# Patient Record
Sex: Female | Born: 1995 | Race: White | Hispanic: No | Marital: Single | Smoking: Never smoker
Health system: Southern US, Community
[De-identification: ages and names within clinical notes are randomized; demographics above are authoritative.]

## PROBLEM LIST (undated history)

## (undated) HISTORY — PX: APPENDECTOMY: SHX54

---
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
## 2015-02-16 DIAGNOSIS — I456 Pre-excitation syndrome: Secondary | ICD-10-CM | POA: Diagnosis not present

## 2015-02-16 DIAGNOSIS — J029 Acute pharyngitis, unspecified: Secondary | ICD-10-CM | POA: Diagnosis not present

## 2015-02-16 DIAGNOSIS — J069 Acute upper respiratory infection, unspecified: Secondary | ICD-10-CM | POA: Diagnosis not present

## 2015-04-16 ENCOUNTER — Ambulatory Visit
Admission: RE | Admit: 2015-04-16 | Discharge: 2015-04-16 | Disposition: A | Discharge status: Home / Self Care | Payer: Managed Care, Other (non HMO) | Source: Ambulatory Visit | Attending: Family Medicine | Admitting: Family Medicine

## 2015-04-16 ENCOUNTER — Other Ambulatory Visit: Payer: Self-pay | Admitting: Family Medicine

## 2015-04-16 DIAGNOSIS — R1031 Right lower quadrant pain: Secondary | ICD-10-CM

## 2015-04-17 ENCOUNTER — Other Ambulatory Visit: Payer: Self-pay | Admitting: Family Medicine

## 2015-04-17 ENCOUNTER — Ambulatory Visit: Payer: BLUE CROSS/BLUE SHIELD

## 2015-04-17 ENCOUNTER — Ambulatory Visit
Admission: RE | Admit: 2015-04-17 | Discharge: 2015-04-17 | Disposition: A | Discharge status: Home / Self Care | Payer: Managed Care, Other (non HMO) | Source: Ambulatory Visit | Attending: Family Medicine | Admitting: Family Medicine

## 2015-04-17 DIAGNOSIS — R102 Pelvic and perineal pain: Secondary | ICD-10-CM | POA: Insufficient documentation

## 2015-04-17 DIAGNOSIS — N83202 Unspecified ovarian cyst, left side: Secondary | ICD-10-CM | POA: Insufficient documentation

## 2015-04-17 MED ORDER — IOPAMIDOL (ISOVUE-300) INJECTION 61%
100.0000 mL | Freq: Once | INTRAVENOUS | Status: AC | PRN
  Administered 2015-04-17: 100 mL via INTRAVENOUS

## 2015-05-14 ENCOUNTER — Ambulatory Visit: Payer: BLUE CROSS/BLUE SHIELD | Admitting: Family Medicine

## 2015-05-15 ENCOUNTER — Encounter: Payer: Self-pay | Admitting: Family Medicine

## 2015-05-15 ENCOUNTER — Ambulatory Visit (INDEPENDENT_AMBULATORY_CARE_PROVIDER_SITE_OTHER): Payer: Managed Care, Other (non HMO) | Admitting: Family Medicine

## 2015-05-15 VITALS — BP 108/63 | HR 57 | Temp 98.8°F | Resp 16

## 2015-05-15 DIAGNOSIS — I456 Pre-excitation syndrome: Secondary | ICD-10-CM

## 2015-05-15 NOTE — Progress Notes (Signed)
Patient ID: Cheryl Shelton, female   DOB: 26-Feb-1995, 20 y.o.   MRN: [id number]  Patient presents today for EKG. Patient patient had her ablation on 04/07/2015. An EKG was requested by the cardiologist a month later. Patient denies having any cardiac problems since ablation. She has been doing physical activity without having any chest pain, palpitations, shortness of breath, syncope, presyncope, headache. 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 At shows sinus bradycardia which is normal for this athlete. We will send a copy of the EKG to her EP physician, Dr. Delight Ovensallan. She has been released for full athletic activity from the EP physician.

## 2015-05-18 ENCOUNTER — Ambulatory Visit (INDEPENDENT_AMBULATORY_CARE_PROVIDER_SITE_OTHER): Payer: Managed Care, Other (non HMO) | Admitting: Family Medicine

## 2015-05-18 ENCOUNTER — Encounter: Payer: Self-pay | Admitting: Family Medicine

## 2015-05-18 VITALS — BP 114/53 | HR 73 | Temp 98.3°F | Resp 16

## 2015-05-18 DIAGNOSIS — J039 Acute tonsillitis, unspecified: Secondary | ICD-10-CM | POA: Diagnosis not present

## 2015-05-18 MED ORDER — AMOXICILLIN 500 MG PO CAPS: 500.0000 mg | ORAL_CAPSULE | Freq: Two times a day (BID) | ORAL | Status: AC

## 2015-05-18 NOTE — Progress Notes (Addendum)
Patient ID: Cheryl Shelton, female   DOB: 11/26/1995, 20 y.o.   MRN: [id number]  Patient presents today with symptoms of sore throat and fever. Patient states that she's had the symptoms for the last 2 days. She denies any cough, abdominal pain. She has some fatigue. She does have a neighbor who has had strep pharyngitis recently. She denies any other symptoms. She did take Tylenol earlier this morning.  ROS: Negative except mentioned above.  Vitals as per Epic. GENERAL: NAD HEENT: moderate pharyngeal erythema, mild tonsillar enlargement bilaterally, no exudate, no erythema of TMs, mild  cervical LAD RESP: CTA B CARD: RRR ABD: +BS, NT/ND, no organomegly appreciated NEURO: CN II-XII grossly intact   A/P: Pharngitis/Tonsillitis- rapid strep test was positive after patient left, flu screen was negative, will treat with Amoxicillin. Rest, hydration, Tylenol/Motrin when necessary, seek medical attention if symptoms persist or worsen. She is not to do any physical activity until her fever has resolved.

## 2016-02-29 ENCOUNTER — Ambulatory Visit (INDEPENDENT_AMBULATORY_CARE_PROVIDER_SITE_OTHER): Payer: BLUE CROSS/BLUE SHIELD | Admitting: Family Medicine

## 2016-02-29 ENCOUNTER — Encounter: Payer: Self-pay | Admitting: Family Medicine

## 2016-02-29 VITALS — BP 120/62 | HR 55 | Temp 98.5°F | Resp 14

## 2016-02-29 DIAGNOSIS — J029 Acute pharyngitis, unspecified: Secondary | ICD-10-CM

## 2016-02-29 DIAGNOSIS — H578 Other specified disorders of eye and adnexa: Secondary | ICD-10-CM

## 2016-02-29 DIAGNOSIS — H5789 Other specified disorders of eye and adnexa: Secondary | ICD-10-CM

## 2016-02-29 LAB — POCT RAPID STREP A (OFFICE): RAPID STREP A SCREEN: NEGATIVE

## 2016-02-29 LAB — POCT INFLUENZA A/B
INFLUENZA A, POC: NEGATIVE
INFLUENZA B, POC: NEGATIVE

## 2016-02-29 MED ORDER — POLYMYXIN B-TRIMETHOPRIM 10000-0.1 UNIT/ML-% OP SOLN: 1.0000 [drp] | Freq: Four times a day (QID) | OPHTHALMIC | 0 refills | Status: AC

## 2016-02-29 NOTE — Progress Notes (Signed)
Patient presents with symptoms of left eye redness, sore throat and postnasal drip. Patient's symptoms have been going on for about 2 days. Patient denies any fever, chills, productive cough. She does have teammates that have the flu. She denies any significant drainage from the eyes. Patient denies wearing contact lenses or glasses.  ROS: Negative except mentioned above. Vitals as per Epic. GENERAL: NAD HEENT: mild pharyngeal erythema, no exudate, no erythema of TMs, no cervical LAD, mild erythema of lateral corner of left eye, no discharge appreciated, PERRL, EOMI RESP: CTA B CARD: RRR NEURO: CN II-XII grossly intact   A/P: Left eye redness, pharyngitis -likely viral, flu screen and rapid strep test were negative in the office, Ibuprofen when necessary, Claritin when necessary, will prescribe Polytrim ophthalmic in case bacterial, wash hands frequently, seek medical attention if symptoms persist or worsen as discussed.

## 2017-04-30 IMAGING — CT CT ABD-PELV W/ CM
2 of 5 series · 15 of 46 positions shown, 17 images · IV contrast (iopamidol)
Comparison: None.

CLINICAL DATA: 19-year-old female with right pelvic pain for 10
days. History of catheter ablation for WPW performed 10 days ago.

EXAM:
CT ABDOMEN AND PELVIS WITH CONTRAST
TECHNIQUE: Multidetector CT imaging of the abdomen and pelvis was performed
using the standard protocol following bolus administration of
intravenous contrast.
CONTRAST:  100mL [id number]-ROO IOPAMIDOL (6OXP1S-ROO) INJECTION 61%

[Series 2: routine abd pel with · axial · 0.63mm/px · z∈[-1490,-1076]mm · 12 of 97 slices shown, 14 images]
[im 7/97  soft-tissue]
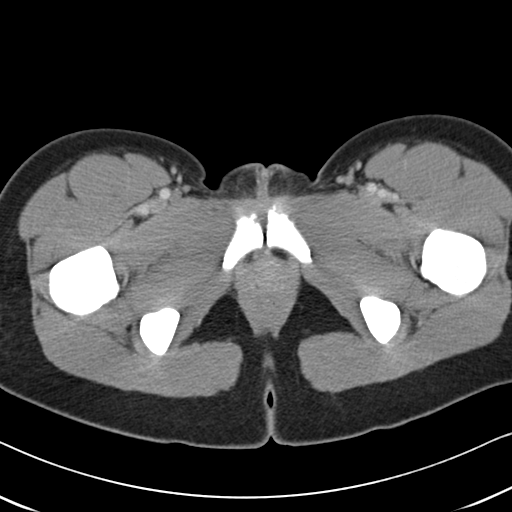
[im 7/97  bone]
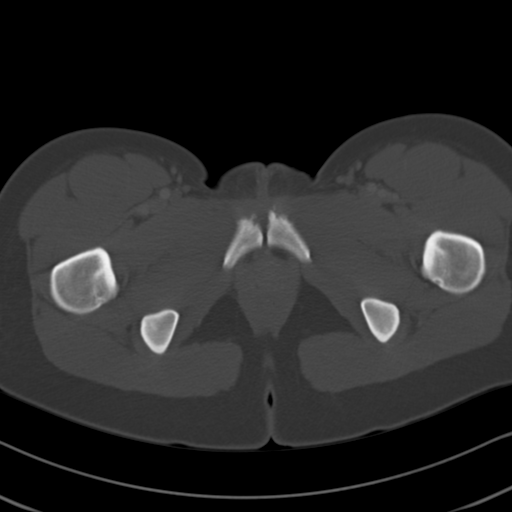
[im 14/97  soft-tissue]
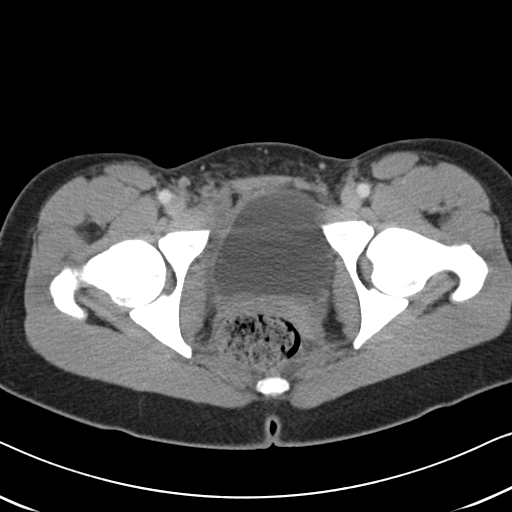
[im 21/97  soft-tissue]
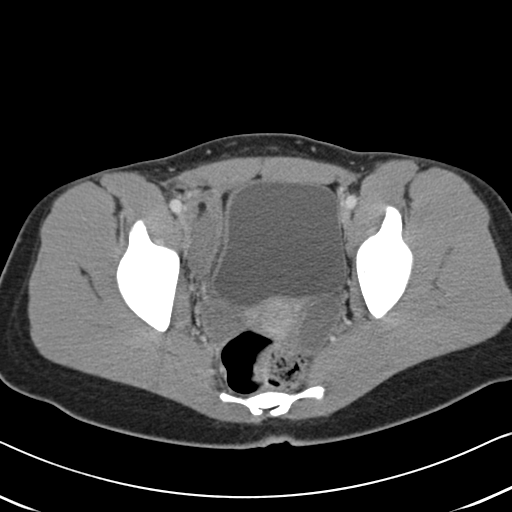
[im 28/97  soft-tissue]
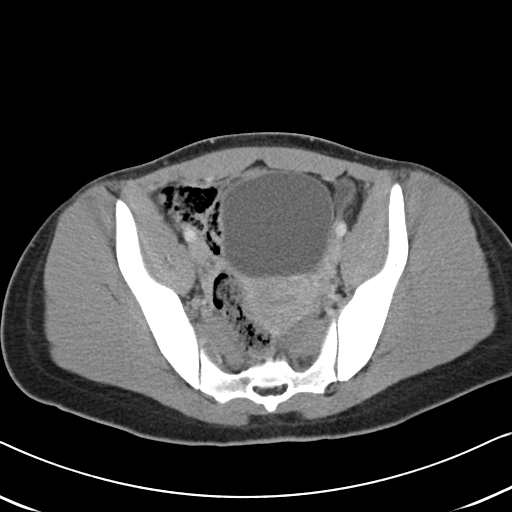
[im 35/97  soft-tissue]
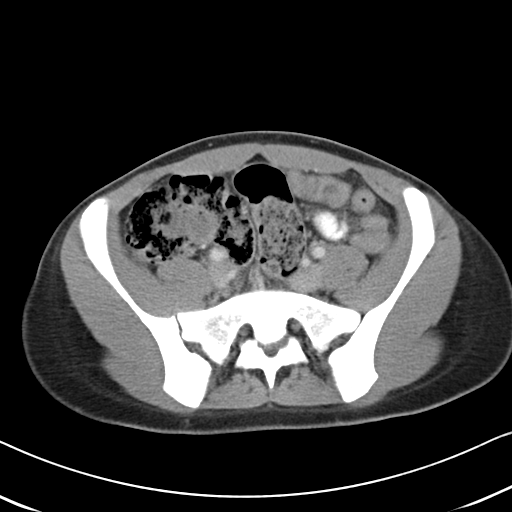
[im 42/97  soft-tissue]
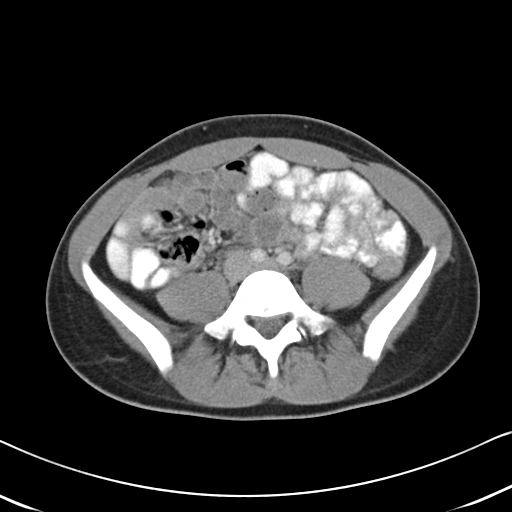
[im 55/97  soft-tissue]
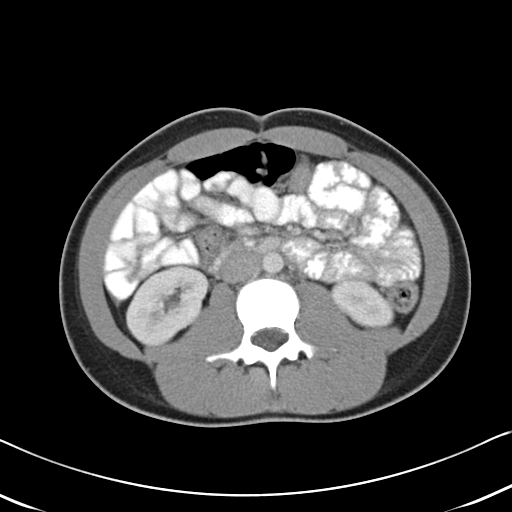
[im 62/97  soft-tissue]
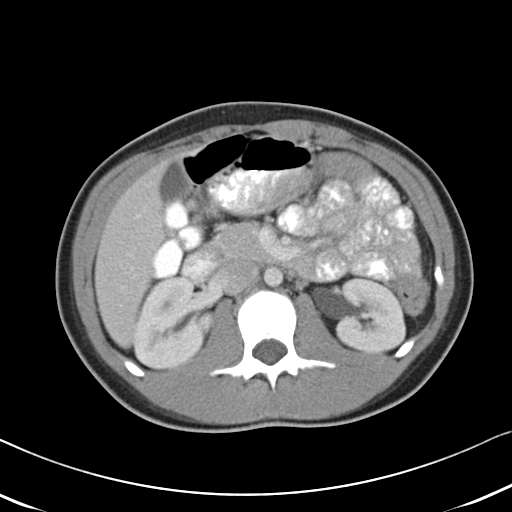
[im 69/97  soft-tissue]
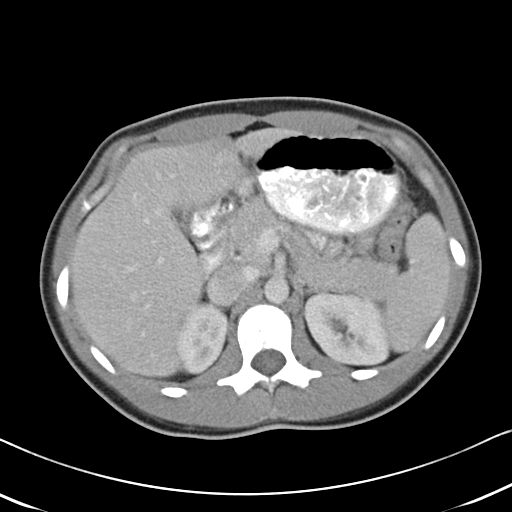
[im 69/97  bone]
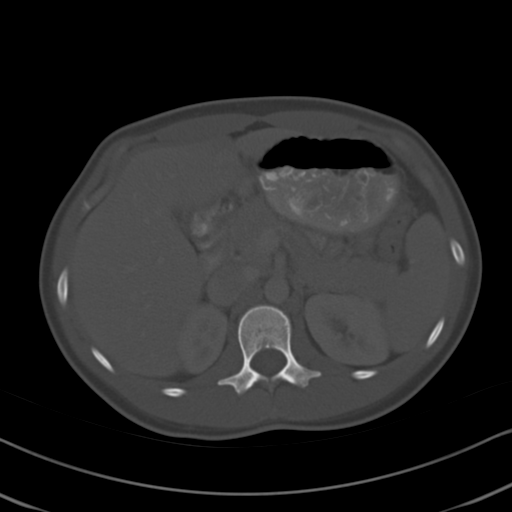
[im 76/97  soft-tissue]
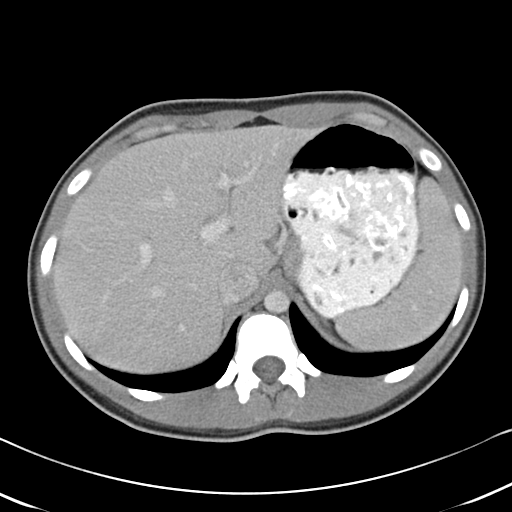
[im 83/97  soft-tissue]
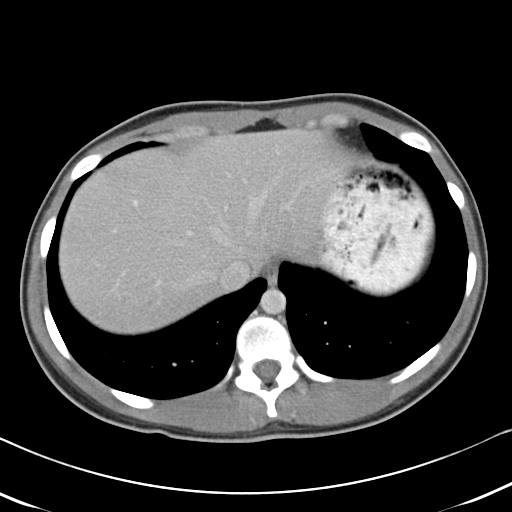
[im 90/97  soft-tissue]
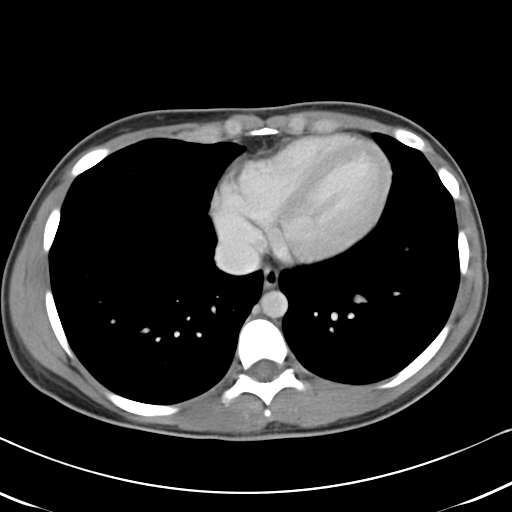

[Series 5: cor routine abd pel with · coronal · 0.54mm/px · 3 of 106 slices shown]
[im 36/106  soft-tissue]
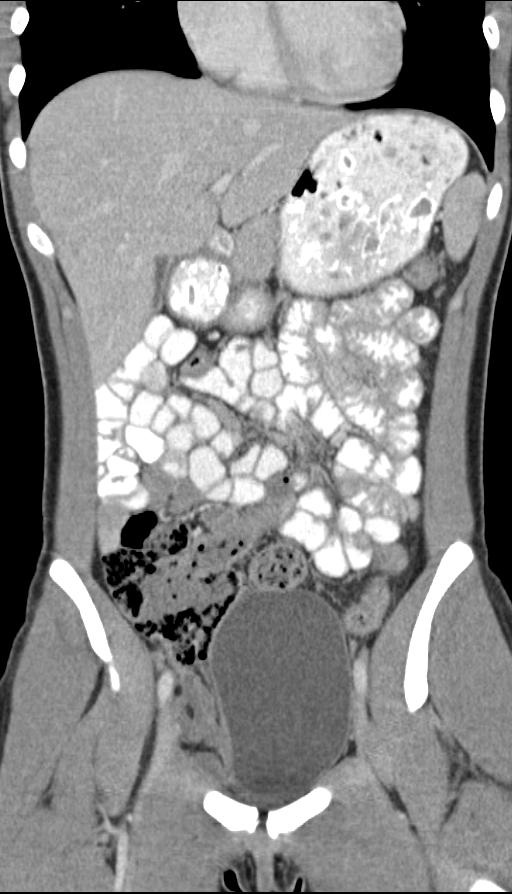
[im 47/106  soft-tissue]
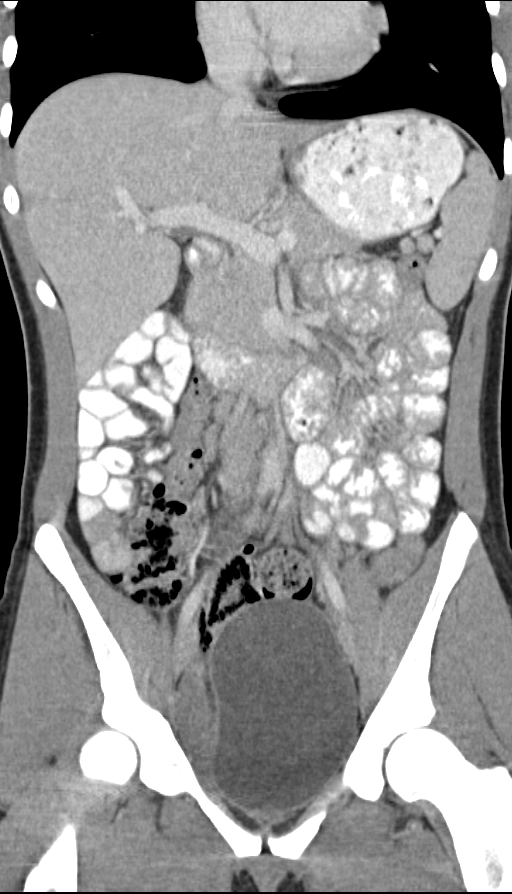
[im 59/106  soft-tissue]
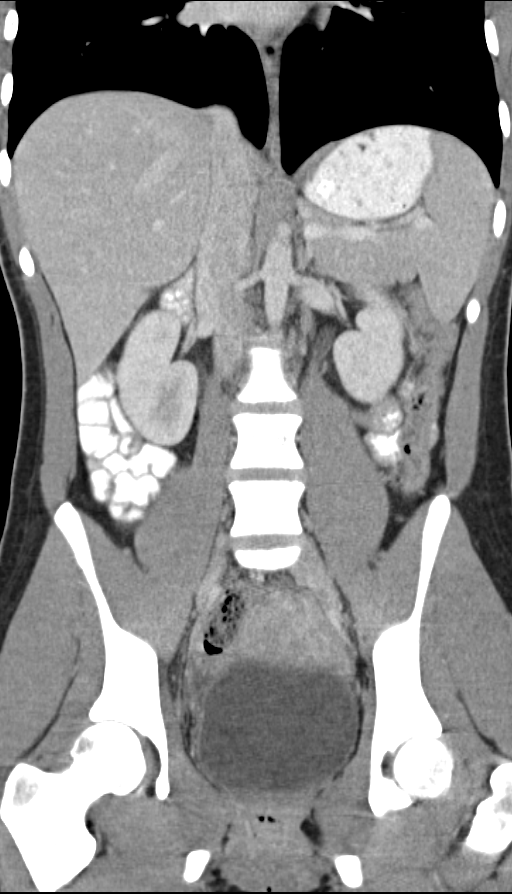

[15 of 46 positions shown; findings below may reference images not displayed]

FINDINGS: Lower chest:  Unremarkable

Hepatobiliary: The liver and gallbladder are unremarkable. There is
no evidence of biliary dilatation.

Pancreas: Unremarkable

Spleen: Unremarkable

Adrenals/Urinary Tract: The kidneys, adrenal glands and bladder are
unremarkable.

Stomach/Bowel: Unremarkable. There is no evidence of bowel
obstruction or focal bowel wall thickening.

Vascular/Lymphatic: Unremarkable. There is no evidence of abdominal
aortic aneurysm or enlarged lymph nodes.

Reproductive: A 1.6 x 4 cm left ovarian cyst is noted.

Other: A 5 x 1.5 x 5 cm ill-defined extraperitoneal soft tissue/
fluid density along the right pelvic side wall (images [id number]) likely
representing an area of hemorrhage/hematoma. No active vertebral
extravasation is noted. There is no evidence of pneumoperitoneum or
ascites.

Musculoskeletal: No acute or suspicious abnormalities identified.
IMPRESSION: 5 x 1.5 x 5 cm area of probable extraperitoneal hemorrhage/hematoma
along the low right pelvic sidewall. No free fluid or active
arterial extravasation.

4 cm left ovarian cyst.

Results were called by telephone at the time of interpretation on
04/17/2015 at [DATE] to Dr. TERCIUS BALHAO , who verbally
acknowledged these results.

## 2018-10-04 ENCOUNTER — Other Ambulatory Visit: Payer: Self-pay

## 2018-10-04 DIAGNOSIS — Z20822 Contact with and (suspected) exposure to covid-19: Secondary | ICD-10-CM

## 2018-10-05 LAB — NOVEL CORONAVIRUS, NAA: SARS-CoV-2, NAA: NOT DETECTED
